# Patient Record
Sex: Female | Born: 1975
Health system: Southern US, Community
[De-identification: ages and names within clinical notes are randomized; demographics above are authoritative.]

## PROBLEM LIST (undated history)

## (undated) DIAGNOSIS — I10 Essential (primary) hypertension: Secondary | ICD-10-CM

## (undated) DIAGNOSIS — K5730 Diverticulosis of large intestine without perforation or abscess without bleeding: Secondary | ICD-10-CM

## (undated) HISTORY — PX: ABDOMINAL HYSTERECTOMY: SHX81

## (undated) HISTORY — PX: COLOSTOMY: SHX63

## (undated) HISTORY — PX: COLOSTOMY REVERSAL: SHX5782

---

## 2013-04-11 ENCOUNTER — Ambulatory Visit: Payer: Self-pay

## 2015-07-09 ENCOUNTER — Ambulatory Visit: Payer: Self-pay | Admitting: Internal Medicine

## 2016-01-01 ENCOUNTER — Encounter (HOSPITAL_BASED_OUTPATIENT_CLINIC_OR_DEPARTMENT_OTHER): Payer: Self-pay | Admitting: Emergency Medicine

## 2016-01-01 ENCOUNTER — Emergency Department (HOSPITAL_BASED_OUTPATIENT_CLINIC_OR_DEPARTMENT_OTHER)
Admission: EM | Admit: 2016-01-01 | Discharge: 2016-01-01 | Disposition: A | Discharge status: Home / Self Care | Payer: Medicaid Other | Attending: Emergency Medicine | Admitting: Emergency Medicine

## 2016-01-01 DIAGNOSIS — J3489 Other specified disorders of nose and nasal sinuses: Secondary | ICD-10-CM | POA: Insufficient documentation

## 2016-01-01 DIAGNOSIS — Y999 Unspecified external cause status: Secondary | ICD-10-CM | POA: Insufficient documentation

## 2016-01-01 DIAGNOSIS — X58XXXA Exposure to other specified factors, initial encounter: Secondary | ICD-10-CM | POA: Insufficient documentation

## 2016-01-01 DIAGNOSIS — R0981 Nasal congestion: Secondary | ICD-10-CM | POA: Insufficient documentation

## 2016-01-01 DIAGNOSIS — S0502XA Injury of conjunctiva and corneal abrasion without foreign body, left eye, initial encounter: Secondary | ICD-10-CM | POA: Insufficient documentation

## 2016-01-01 DIAGNOSIS — R51 Headache: Secondary | ICD-10-CM | POA: Insufficient documentation

## 2016-01-01 DIAGNOSIS — F17200 Nicotine dependence, unspecified, uncomplicated: Secondary | ICD-10-CM | POA: Insufficient documentation

## 2016-01-01 DIAGNOSIS — I10 Essential (primary) hypertension: Secondary | ICD-10-CM | POA: Insufficient documentation

## 2016-01-01 DIAGNOSIS — Y939 Activity, unspecified: Secondary | ICD-10-CM | POA: Insufficient documentation

## 2016-01-01 DIAGNOSIS — H578 Other specified disorders of eye and adnexa: Secondary | ICD-10-CM | POA: Diagnosis present

## 2016-01-01 DIAGNOSIS — Y929 Unspecified place or not applicable: Secondary | ICD-10-CM | POA: Insufficient documentation

## 2016-01-01 HISTORY — DX: Essential (primary) hypertension: I10

## 2016-01-01 HISTORY — DX: Diverticulosis of large intestine without perforation or abscess without bleeding: K57.30

## 2016-01-01 MED ORDER — ACETAMINOPHEN 325 MG PO TABS
650.0000 mg | ORAL_TABLET | Freq: Once | ORAL | Status: AC
  Administered 2016-01-01: 650 mg via ORAL
  Filled 2016-01-01: qty 2

## 2016-01-01 MED ORDER — FLUORESCEIN SODIUM 1 MG OP STRP
1.0000 | ORAL_STRIP | Freq: Once | OPHTHALMIC | Status: AC
  Administered 2016-01-01: 1 via OPHTHALMIC
  Filled 2016-01-01: qty 1

## 2016-01-01 MED ORDER — ERYTHROMYCIN 5 MG/GM OP OINT: 1.0000 "application " | TOPICAL_OINTMENT | Freq: Four times a day (QID) | OPHTHALMIC | Status: DC

## 2016-01-01 MED ORDER — TETRACAINE HCL 0.5 % OP SOLN
1.0000 [drp] | Freq: Once | OPHTHALMIC | Status: AC
  Administered 2016-01-01: 1 [drp] via OPHTHALMIC
  Filled 2016-01-01: qty 4

## 2016-01-01 MED FILL — ERYTHROMYCIN EYE OINTMENT: 5 | 15 days supply | Qty: 4 | Fill #0

## 2016-01-01 NOTE — ED Notes (Signed)
Pt c/o burning and redness to both eyes x 2 weeks. Pt states when she woke up today both eyes were swollen shut. Pt states she has been using stye cream with no relief.

## 2016-01-01 NOTE — Discharge Instructions (Signed)

## 2016-01-01 NOTE — ED Provider Notes (Signed)
CSN: [id number]     Arrival date & time 01/01/16  9562 History   First MD Initiated Contact with Patient 01/01/16 1021     Chief Complaint  Patient presents with  . eye irritation     Florestine Carmical is a 40 y.o. female who presents to the emergency department complaining of left eye irritation for the past 2 weeks that has now progressed to have irritation of her right eye today. The patient reports that for 2 weeks she has had left eye redness, foreign body sensation, irritation and matting discharge. She reports this morning she woke up in both of her eyes were matted shut. She believes of a foreign body sensation to her right thigh. She is unaware of any injury or known foreign body. She does not wear contacts or glasses. She is attended using steroid cream without relief. No fevers. No double vision. She reports sometimes her vision is blurry due to her eyes being watery. She denies fevers, double vision, sore throat, ear pain, ear discharge, or eye injury.  The history is provided by the patient. No language interpreter was used.    Past Medical History  Diagnosis Date  . Hypertension   . Diverticulosis of colon    Past Surgical History  Procedure Laterality Date  . Abdominal hysterectomy    . Colostomy    . Colostomy reversal     No family history on file. Social History  Substance Use Topics  . Smoking status: Current Every Day Smoker  . Smokeless tobacco: None  . Alcohol Use: No   OB History    No data available     Review of Systems  Constitutional: Negative for fever.  HENT: Positive for congestion and rhinorrhea. Negative for ear discharge, ear pain, facial swelling, sore throat and trouble swallowing.   Eyes: Positive for pain, discharge, redness and itching. Negative for visual disturbance.  Respiratory: Negative for cough.   Gastrointestinal: Negative for nausea, vomiting and abdominal pain.  Musculoskeletal: Negative for neck pain.  Skin: Negative for rash.   Neurological: Positive for headaches. Negative for dizziness, syncope, light-headedness and numbness.      Allergies  Review of patient's allergies indicates no known allergies.  Home Medications   Prior to Admission medications   Medication Sig Start Date End Date Taking? Authorizing Provider  erythromycin ophthalmic ointment Place 1 application into both eyes every 6 (six) hours. Place 1/2 inch ribbon of ointment in the affected eye 4 times a day 01/01/16   Everlene Farrier, PA-C   BP 106/84 mmHg  Pulse 63  Temp(Src) 98.2 F (36.8 C) (Oral)  Resp 16  Ht  (1.6 m)  Wt 94.348 kg  BMI 36.85 kg/m2  SpO2 100% Physical Exam  Constitutional: She is oriented to person, place, and time. She appears well-developed and well-nourished. No distress.  Nontoxic appearing.  HENT:  Head: Normocephalic and atraumatic.  Right Ear: External ear normal.  Left Ear: External ear normal.  Mouth/Throat: Oropharynx is clear and moist.  Bilateral tympanic membranes are pearly-gray without erythema or loss of landmarks.   Eyes: EOM are normal. Pupils are equal, round, and reactive to light. Right eye exhibits no discharge. Left eye exhibits no discharge.  Patient has bilateral conjunctival injection which is worse on her left side. EOMs are intact bilaterally. Vision is grossly intact.  Left eye was anesthetized with tetracaine and stained with fluorescein. There appears to be a very small corneal abrasion to the medial aspect of her left  eye. No foreign bodies noted. Patient does have some chemosis noted. Patient reports pain resolved with tetracaine.   Neck: Normal range of motion. Neck supple.  Cardiovascular: Normal rate, regular rhythm, normal heart sounds and intact distal pulses.   Pulmonary/Chest: Effort normal and breath sounds normal. No respiratory distress.  Abdominal: Soft. There is no tenderness.  Lymphadenopathy:    She has no cervical adenopathy.  Neurological: She is alert and  oriented to person, place, and time. No cranial nerve deficit. Coordination normal.  Skin: Skin is warm and dry. No rash noted. She is not diaphoretic. No erythema. No pallor.  Psychiatric: She has a normal mood and affect. Her behavior is normal.  Nursing note and vitals reviewed.   ED Course  Procedures (including critical care time) Labs Review Labs Reviewed - No data to display  Imaging Review No results found.    EKG Interpretation None      Filed Vitals:   01/01/16 0959  BP: 106/84  Pulse: 63  Temp: 98.2 F (36.8 C)  TempSrc: Oral  Resp: 16  Height: 5\' 3"  (1.6 m)  Weight: 94.348 kg  SpO2: 100%      Visual Acuity  Right Eye Distance: 20/50 Left Eye Distance: 20/200 (Patient states she can't see anything but was able to see top line) Bilateral Distance: 20/50 On my exam the patient has  Right eye: 20/40     Left eye: 20/50  Right Eye Near:   Left Eye Near:    Bilateral Near:     MDM   Meds given in ED:  Medications  acetaminophen (TYLENOL) tablet 650 mg (not administered)  fluorescein ophthalmic strip 1 strip (1 strip Both Eyes Given 01/01/16 1035)  tetracaine (PONTOCAINE) 0.5 % ophthalmic solution 1 drop (1 drop Both Eyes Given 01/01/16 1035)    New Prescriptions   ERYTHROMYCIN OPHTHALMIC OINTMENT    Place 1 application into both eyes every 6 (six) hours. Place 1/2 inch ribbon of ointment in the affected eye 4 times a day    Final diagnoses:  Corneal abrasion, left, initial encounter    This is a 40 y.o. female Who presents to the emergency department complaining of left eye irritation for the past 2 weeks that has now progressed to have irritation of her right eye today. The patient reports that for 2 weeks she has had left eye redness, foreign body sensation, irritation and matting discharge. She reports this morning she woke up in both of her eyes were matted shut. She believes of a foreign body sensation to her right thigh. She is unaware of any injury  or known foreign body. She does not wear contacts or glasses. She is attended using steroid cream without relief. No fevers. No double vision. She reports sometimes her vision is blurry due to her eyes being watery. On exam the patient is afebrile nontoxic appearing. She has bilateral conjunctival injection with her left being worse than her right. She complains of pain and foreign body sensation to her left eye. Left eye was anesthetized with tetracaine and stained with fluorescein. Patient appears have a small corneal abrasion to the medial aspect of her left eye. This is where she complains of her pain. She reports her pain completely resolved with tetracaine. Patient with small corneal abrasion. Will provide with erythromycin ophthalmic ointment and encouraged her to follow-up with ophthalmology. She declined any prescriptions for any pain medicine for her eye. I discussed return precautions. I advised the patient to follow-up with their  primary care provider this week. I advised the patient to return to the emergency department with new or worsening symptoms or new concerns. The patient verbalized understanding and agreement with plan.      Everlene Farrier, PA-C 01/01/16 1053  Jerelyn Scott, MD 01/01/16 1058

## 2017-04-18 ENCOUNTER — Emergency Department (HOSPITAL_COMMUNITY): Payer: Self-pay

## 2017-04-18 ENCOUNTER — Encounter (HOSPITAL_COMMUNITY): Payer: Self-pay | Admitting: *Deleted

## 2017-04-18 ENCOUNTER — Emergency Department (HOSPITAL_COMMUNITY)
Admission: EM | Admit: 2017-04-18 | Discharge: 2017-04-18 | Disposition: A | Discharge status: Home / Self Care | Payer: Self-pay | Attending: Emergency Medicine | Admitting: Emergency Medicine

## 2017-04-18 DIAGNOSIS — F329 Major depressive disorder, single episode, unspecified: Secondary | ICD-10-CM

## 2017-04-18 DIAGNOSIS — M5489 Other dorsalgia: Secondary | ICD-10-CM | POA: Insufficient documentation

## 2017-04-18 DIAGNOSIS — I10 Essential (primary) hypertension: Secondary | ICD-10-CM | POA: Insufficient documentation

## 2017-04-18 DIAGNOSIS — M549 Dorsalgia, unspecified: Secondary | ICD-10-CM

## 2017-04-18 DIAGNOSIS — F3289 Other specified depressive episodes: Secondary | ICD-10-CM | POA: Insufficient documentation

## 2017-04-18 DIAGNOSIS — R4589 Other symptoms and signs involving emotional state: Secondary | ICD-10-CM

## 2017-04-18 DIAGNOSIS — G8929 Other chronic pain: Secondary | ICD-10-CM

## 2017-04-18 DIAGNOSIS — F17200 Nicotine dependence, unspecified, uncomplicated: Secondary | ICD-10-CM | POA: Insufficient documentation

## 2017-04-18 DIAGNOSIS — R109 Unspecified abdominal pain: Secondary | ICD-10-CM | POA: Insufficient documentation

## 2017-04-18 LAB — URINALYSIS, ROUTINE W REFLEX MICROSCOPIC
BILIRUBIN URINE: NEGATIVE
Glucose, UA: NEGATIVE mg/dL
Hgb urine dipstick: NEGATIVE
KETONES UR: NEGATIVE mg/dL
Leukocytes, UA: NEGATIVE
NITRITE: NEGATIVE
PROTEIN: NEGATIVE mg/dL
Specific Gravity, Urine: <1.005 — ABNORMAL LOW (ref 1.005–1.030)
pH: 6 (ref 5.0–8.0)

## 2017-04-18 LAB — CBC
HCT: 38.8 % (ref 36.0–46.0)
HEMOGLOBIN: 12.3 g/dL (ref 12.0–15.0)
MCH: 25.3 pg — ABNORMAL LOW (ref 26.0–34.0)
MCHC: 31.7 g/dL (ref 30.0–36.0)
MCV: 79.7 fL (ref 78.0–100.0)
PLATELETS: 305 10*3/uL (ref 150–400)
RBC: 4.87 MIL/uL (ref 3.87–5.11)
RDW: 16.4 % — AB (ref 11.5–15.5)
WBC: 5.8 10*3/uL (ref 4.0–10.5)

## 2017-04-18 LAB — COMPREHENSIVE METABOLIC PANEL
ALBUMIN: 3.7 g/dL (ref 3.5–5.0)
ALK PHOS: 64 U/L (ref 38–126)
ALT: 13 U/L — ABNORMAL LOW (ref 14–54)
ANION GAP: 5 (ref 5–15)
AST: 18 U/L (ref 15–41)
BILIRUBIN TOTAL: 0.4 mg/dL (ref 0.3–1.2)
BUN: 10 mg/dL (ref 6–20)
CALCIUM: 9.5 mg/dL (ref 8.9–10.3)
CO2: 29 mmol/L (ref 22–32)
Chloride: 104 mmol/L (ref 101–111)
Creatinine, Ser: 0.79 mg/dL (ref 0.44–1.00)
GFR calc Af Amer: >60 mL/min (ref >60)
GFR calc non Af Amer: >60 mL/min (ref >60)
GLUCOSE: 102 mg/dL — AB (ref 65–99)
Potassium: 3.9 mmol/L (ref 3.5–5.1)
Sodium: 138 mmol/L (ref 135–145)
TOTAL PROTEIN: 6.6 g/dL (ref 6.5–8.1)

## 2017-04-18 LAB — LIPASE, BLOOD: Lipase: 22 U/L (ref 11–51)

## 2017-04-18 MED ORDER — IOPAMIDOL (ISOVUE-300) INJECTION 61%
INTRAVENOUS | Status: AC
  Administered 2017-04-18: 100 mL
  Filled 2017-04-18: qty 100

## 2017-04-18 MED ORDER — SODIUM CHLORIDE 0.9 % IV SOLN
80.0000 mg | Freq: Once | INTRAVENOUS | Status: AC
  Administered 2017-04-18: 80 mg via INTRAVENOUS
  Filled 2017-04-18: qty 80

## 2017-04-18 MED ORDER — SODIUM CHLORIDE 0.9 % IV BOLUS (SEPSIS)
500.0000 mL | Freq: Once | INTRAVENOUS | Status: AC
  Administered 2017-04-18: 500 mL via INTRAVENOUS

## 2017-04-18 NOTE — ED Triage Notes (Signed)
The pt is c/o abd pain rectal pain back pain headache for months  lmp   none

## 2017-04-18 NOTE — Discharge Instructions (Signed)
Please call the number discharge instructions for referral for primary care follow-up.

## 2017-04-18 NOTE — ED Provider Notes (Signed)
[facility]-EMERGENCY DEPT Provider Note   CSN: [id number] Arrival date & time: 04/18/17  1440     History   Chief Complaint Chief Complaint  Patient presents with  . Generalized Body Aches    HPI Haley Parker is a 41 y.o. female.  HPI This is a 41 year old female history of hypertension who presents today stating that she has had chronic back pain and abdominal pain. She states the back pain has been stable. The abdominal pain has appeared worse it is in the upper abdomen. It is constant in nature. She states that she has had some nausea and decreased appetite but has not lost any weight. She denies fever or chills. She states that menstrual cycles are normal. She denies any vaginal discharge or urinary tract infection symptoms. She denies any bright red blood per rectum or dark tarry stools. She denies any history of gastritis or GI bleeding. Past Medical History:  Diagnosis Date  . Diverticulosis of colon   . Hypertension     There are no active problems to display for this patient.   Past Surgical History:  Procedure Laterality Date  . ABDOMINAL HYSTERECTOMY    . COLOSTOMY    . COLOSTOMY REVERSAL      OB History    No data available       Home Medications    Prior to Admission medications   Medication Sig Start Date End Date Taking? Authorizing Provider  ibuprofen (ADVIL,MOTRIN) 200 MG tablet Take 400 mg by mouth every 6 (six) hours as needed (pain).   Yes [provider]    Family History No family history on file.  Social History Social History  Substance Use Topics  . Smoking status: Current Every Day Smoker  . Smokeless tobacco: Never Used  . Alcohol use No     Allergies   Patient has no known allergies.   Review of Systems Review of Systems  Constitutional: Positive for appetite change. Negative for unexpected weight change.  HENT: Negative.   Eyes: Negative.   Respiratory: Negative.   Cardiovascular: Negative.   Gastrointestinal:  Negative.   Endocrine: Negative.   Genitourinary: Negative.   Musculoskeletal: Negative.   Skin: Negative.   Neurological: Negative.   Hematological: Negative.   Psychiatric/Behavioral: Positive for dysphoric mood. Negative for suicidal ideas.  All other systems reviewed and are negative.    Physical Exam Updated Vital Signs BP (!) 142/94   Pulse 69   Temp 98.7 F (37.1 C) (Oral)   Resp 16   Ht 1.6 m (5\' 3" )   Wt 108.9 kg (240 lb)   SpO2 98%   BMI 42.51 kg/m   Physical Exam  Constitutional: She is oriented to person, place, and time. She appears well-developed and well-nourished. No distress.  HENT:  Head: Normocephalic and atraumatic.  Right Ear: External ear normal.  Left Ear: External ear normal.  Nose: Nose normal.  Eyes: Pupils are equal, round, and reactive to light. Conjunctivae and EOM are normal.  Neck: Normal range of motion. Neck supple.  Pulmonary/Chest: Effort normal.  Musculoskeletal: Normal range of motion.  Neurological: She is alert and oriented to person, place, and time. She exhibits normal muscle tone. Coordination normal.  Skin: Skin is warm and dry. Capillary refill takes less than 2 seconds.  Psychiatric: She has a normal mood and affect. Her speech is normal and behavior is normal. Judgment and thought content normal. Cognition and memory are normal.  Patient endorses some dysphoria but denies any thoughts of  harming herself or anybody else.  Nursing note and vitals reviewed.    ED Treatments / Results  Labs (all labs ordered are listed, but only abnormal results are displayed) Labs Reviewed  COMPREHENSIVE METABOLIC PANEL - Abnormal; Notable for the following:       Result Value   Glucose, Bld 102 (*)    ALT 13 (*)    All other components within normal limits  CBC - Abnormal; Notable for the following:    MCH 25.3 (*)    RDW 16.4 (*)    All other components within normal limits  URINALYSIS, ROUTINE W REFLEX MICROSCOPIC - Abnormal;  Notable for the following:    Specific Gravity, Urine <1.005 (*)    All other components within normal limits  LIPASE, BLOOD    EKG  EKG Interpretation None       Radiology Ct Abdomen Pelvis W Contrast  Result Date: 04/18/2017 CLINICAL DATA:  Subacute onset of left upper quadrant abdominal pain. Initial encounter. EXAM: CT ABDOMEN AND PELVIS WITH CONTRAST TECHNIQUE: Multidetector CT imaging of the abdomen and pelvis was performed using the standard protocol following bolus administration of intravenous contrast. CONTRAST:  ISOVUE-300 IOPAMIDOL (ISOVUE-300) INJECTION 61% COMPARISON:  None. FINDINGS: Lower chest: Minimal scarring is noted at the right lung base. The visualized portions of the mediastinum are unremarkable. Hepatobiliary: The liver is unremarkable in appearance. The gallbladder is unremarkable in appearance. The common bile duct remains normal in caliber. Pancreas: The pancreas is within normal limits. Spleen: The spleen is unremarkable in appearance. Adrenals/Urinary Tract: The adrenal glands are unremarkable in appearance. The kidneys are within normal limits. There is no evidence of hydronephrosis. No renal or ureteral stones are identified. No perinephric stranding is seen. Stomach/Bowel: The stomach is unremarkable in appearance. The small bowel is within normal limits. The appendix is normal in caliber, without evidence of appendicitis. Postoperative change is noted at the sigmoid colon. Minimal diverticulosis is noted near the end-to-side anastomosis. Vascular/Lymphatic: The abdominal aorta is unremarkable in appearance. Minimal calcification is noted at the left common iliac artery. The inferior vena cava is grossly unremarkable. No retroperitoneal lymphadenopathy is seen. No pelvic sidewall lymphadenopathy is identified. Reproductive: The bladder is mildly distended grossly unremarkable. The patient is status post hysterectomy. No suspicious adnexal masses are seen. Other:  Mild postoperative change is noted along the anterior abdominal wall. Musculoskeletal: No acute osseous abnormalities are identified. The visualized musculature is unremarkable in appearance. IMPRESSION: 1. No acute abnormality seen within the abdomen or pelvis. 2. Minimal diverticulosis at the sigmoid colon. Electronically Signed   By: Roanna Raider M.D.   On: 04/18/2017 22:50    Procedures Procedures (including critical care time)  Medications Ordered in ED Medications  sodium chloride 0.9 % bolus 500 mL (500 mLs Intravenous New Bag/Given 04/18/17 2114)  pantoprazole (PROTONIX) 80 mg in sodium chloride 0.9 % 100 mL IVPB (0 mg Intravenous Stopped 04/18/17 2136)  iopamidol (ISOVUE-300) 61 % injection (100 mLs  Contrast Given 04/18/17 2225)     Initial Impression / Assessment and Plan / ED Course  I have reviewed the triage vital signs and the nursing notes.  Pertinent labs & imaging results that were available during my care of the patient were reviewed by me and considered in my medical decision making (see chart for details).     41 year old female presents today complaining of some dysphoria and chronic pain. She endorses that the abdominal pain has worsened recently. A CT of the abdomen is obtained  and shows no acute abnormality. She also states that she has missed work for the past 3 days. She is given a work note to return. She is also given resources in the community for behavioral health. She is advised regarding finding follow-up for primary care and voices understanding.  Final Clinical Impressions(s) / ED Diagnoses   Final diagnoses:  Depressed mood  Chronic abdominal pain  Chronic back pain, unspecified back location, unspecified back pain laterality    New Prescriptions New Prescriptions   No medications on file     Margarita Grizzle, MD 04/19/17 2302

## 2018-09-04 ENCOUNTER — Emergency Department (HOSPITAL_COMMUNITY)
Admission: EM | Admit: 2018-09-04 | Discharge: 2018-09-04 | Disposition: A | Discharge status: Home / Self Care | Payer: Self-pay | Attending: Emergency Medicine | Admitting: Emergency Medicine

## 2018-09-04 ENCOUNTER — Encounter (HOSPITAL_COMMUNITY): Payer: Self-pay

## 2018-09-04 DIAGNOSIS — I10 Essential (primary) hypertension: Secondary | ICD-10-CM | POA: Insufficient documentation

## 2018-09-04 DIAGNOSIS — B029 Zoster without complications: Secondary | ICD-10-CM | POA: Insufficient documentation

## 2018-09-04 DIAGNOSIS — Z79899 Other long term (current) drug therapy: Secondary | ICD-10-CM | POA: Insufficient documentation

## 2018-09-04 DIAGNOSIS — F17210 Nicotine dependence, cigarettes, uncomplicated: Secondary | ICD-10-CM | POA: Insufficient documentation

## 2018-09-04 MED ORDER — VALACYCLOVIR HCL 1 G PO TABS: 1000.0000 mg | ORAL_TABLET | Freq: Three times a day (TID) | ORAL | 0 refills | Status: AC

## 2018-09-04 NOTE — ED Provider Notes (Signed)
[facility]   CSN: [id number] Arrival date & time: 09/04/18  1154     History   Chief Complaint Chief Complaint  Patient presents with  . Rash    HPI Haley Parker is a 43 y.o. female.  The history is provided by the patient. No language interpreter was used.  Rash     Haley Parker is a 43 y.o. female who presents to the Emergency Department complaining of rash. She presents to the emergency department complaining of itchy and painful rash to the left flank that began about four days ago. She developed pain to the left side about five days ago followed by development of the rash. She has a passion her left upper quadrant and pain wraps around her back. She denies any fevers, shortness of breath, nausea, vomiting. She has a history of hypertension. No additional medical problems. She works as a Lawyer[employer]NA. Symptoms are moderate, constant, worsening. Past Medical History:  Diagnosis Date  . Diverticulosis of colon   . Hypertension     There are no active problems to display for this patient.   Past Surgical History:  Procedure Laterality Date  . ABDOMINAL HYSTERECTOMY    . COLOSTOMY    . COLOSTOMY REVERSAL       OB History   No obstetric history on file.      Home Medications    Prior to Admission medications   Medication Sig Start Date End Date Taking? Authorizing Provider  ibuprofen (ADVIL,MOTRIN) 200 MG tablet Take 400 mg by mouth every 6 (six) hours as needed (pain).    [provider]  valACYclovir (VALTREX) 1000 MG tablet Take 1 tablet (1,000 mg total) by mouth 3 (three) times daily for 7 days. 09/04/18 09/11/18  Tilden Fossaees, Micayla Brathwaite, MD    Family History No family history on file.  Social History Social History   Tobacco Use  . Smoking status: Current Every Day Smoker  . Smokeless tobacco: Never Used  Substance Use Topics  . Alcohol use: No  . Drug use: No     Allergies   Patient has no known  allergies.   Review of Systems Review of Systems  Skin: Positive for rash.  All other systems reviewed and are negative.    Physical Exam Updated Vital Signs BP (!) 144/108 (BP Location: Right Arm)   Pulse 64   Temp (!) 97.5 F (36.4 C) (Oral)   Resp 16   SpO2 97%   Physical Exam Vitals signs and nursing note reviewed.  Constitutional:      Appearance: She is well-developed.  HENT:     Head: Normocephalic and atraumatic.  Cardiovascular:     Rate and Rhythm: Normal rate and regular rhythm.  Pulmonary:     Effort: Pulmonary effort is normal. No respiratory distress.  Abdominal:     Palpations: Abdomen is soft.     Tenderness: There is no abdominal tenderness. There is no guarding or rebound.  Musculoskeletal:        General: No tenderness.     Comments: 2+ DP pulses bilaterally  Skin:    General: Skin is warm and dry.     Comments: Vesicular patch to the left upper quadrant with a few scattered papules to the left mid back. There is crusting over the some of the lesions in the left upper quadrant.  Neurological:     Mental Status: She is alert and oriented to person, place, and time.  Comments: Five out of five strength in all four extremities with sensation to light touch intact in all four extremities  Psychiatric:        Behavior: Behavior normal.      ED Treatments / Results  Labs (all labs ordered are listed, but only abnormal results are displayed) Labs Reviewed - No data to display  EKG None  Radiology No results found.  Procedures Procedures (including critical care time)  Medications Ordered in ED Medications - No data to display   Initial Impression / Assessment and Plan / ED Course  I have reviewed the triage vital signs and the nursing notes.  Pertinent labs & imaging results that were available during my care of the patient were reviewed by me and considered in my medical decision making (see chart for details).     Patient here  for evaluation of rash to her left flank. Examination is consistent with zoster. There are no complicating features such as cellulitis, abscess. Only one dermatome appears to be involved. Counseled patient on home care for shingles. Discussed contact precautions. Discussed outpatient follow-up and return precautions.  Final Clinical Impressions(s) / ED Diagnoses   Final diagnoses:  Herpes zoster without complication    ED Discharge Orders         Ordered    valACYclovir (VALTREX) 1000 MG tablet  3 times daily     09/04/18 1333           Tilden Fossa, MD 09/04/18 1336

## 2018-09-04 NOTE — ED Triage Notes (Signed)
Patient here with 6 days of rash with pain to left side, no drainage.

## 2018-09-04 NOTE — ED Notes (Signed)
Declined W/C at D/C and was escorted to lobby by RN. 

## 2018-09-28 ENCOUNTER — Inpatient Hospital Stay: Payer: Self-pay | Admitting: Critical Care Medicine

## 2018-10-20 ENCOUNTER — Inpatient Hospital Stay: Payer: Self-pay | Admitting: Critical Care Medicine

## 2020-03-13 ENCOUNTER — Emergency Department (HOSPITAL_COMMUNITY)
Admission: EM | Admit: 2020-03-13 | Discharge: 2020-03-13 | Disposition: A | Discharge status: Home / Self Care | Payer: Self-pay | Attending: Emergency Medicine | Admitting: Emergency Medicine

## 2020-03-13 ENCOUNTER — Other Ambulatory Visit: Payer: Self-pay

## 2020-03-13 ENCOUNTER — Encounter (HOSPITAL_COMMUNITY): Payer: Self-pay

## 2020-03-13 DIAGNOSIS — Z5321 Procedure and treatment not carried out due to patient leaving prior to being seen by health care provider: Secondary | ICD-10-CM | POA: Insufficient documentation

## 2020-03-13 DIAGNOSIS — K0889 Other specified disorders of teeth and supporting structures: Secondary | ICD-10-CM | POA: Insufficient documentation

## 2020-03-13 MED ORDER — ONDANSETRON 4 MG PO TBDP
4.0000 mg | ORAL_TABLET | Freq: Once | ORAL | Status: AC | PRN
  Administered 2020-03-13: 4 mg via ORAL
  Filled 2020-03-13: qty 1

## 2020-03-13 MED ORDER — OXYCODONE-ACETAMINOPHEN 5-325 MG PO TABS
1.0000 | ORAL_TABLET | Freq: Once | ORAL | Status: AC
  Administered 2020-03-13: 1 via ORAL
  Filled 2020-03-13: qty 1

## 2020-03-13 NOTE — ED Triage Notes (Signed)
Pt reports left upper dental pain for the past 2 days. Swelling noted.

## 2020-03-13 NOTE — ED Notes (Signed)
Pt leaving due to not wanting to wait any longer

## 2020-06-05 ENCOUNTER — Ambulatory Visit: Payer: Self-pay | Admitting: Family Medicine

## 2020-08-29 ENCOUNTER — Ambulatory Visit: Payer: Self-pay | Admitting: Family Medicine

## 2020-11-30 ENCOUNTER — Emergency Department (HOSPITAL_COMMUNITY): Payer: Self-pay

## 2020-11-30 ENCOUNTER — Encounter (HOSPITAL_COMMUNITY): Payer: Self-pay

## 2020-11-30 ENCOUNTER — Emergency Department (HOSPITAL_COMMUNITY)
Admission: EM | Admit: 2020-11-30 | Discharge: 2020-11-30 | Disposition: A | Discharge status: Home / Self Care | Payer: Self-pay | Attending: Emergency Medicine | Admitting: Emergency Medicine

## 2020-11-30 ENCOUNTER — Telehealth: Payer: Self-pay | Admitting: Internal Medicine

## 2020-11-30 DIAGNOSIS — R109 Unspecified abdominal pain: Secondary | ICD-10-CM

## 2020-11-30 DIAGNOSIS — R112 Nausea with vomiting, unspecified: Secondary | ICD-10-CM | POA: Insufficient documentation

## 2020-11-30 DIAGNOSIS — R197 Diarrhea, unspecified: Secondary | ICD-10-CM | POA: Insufficient documentation

## 2020-11-30 DIAGNOSIS — F17200 Nicotine dependence, unspecified, uncomplicated: Secondary | ICD-10-CM | POA: Insufficient documentation

## 2020-11-30 DIAGNOSIS — R1032 Left lower quadrant pain: Secondary | ICD-10-CM | POA: Insufficient documentation

## 2020-11-30 DIAGNOSIS — I10 Essential (primary) hypertension: Secondary | ICD-10-CM | POA: Insufficient documentation

## 2020-11-30 LAB — CBC WITH DIFFERENTIAL/PLATELET
Abs Immature Granulocytes: 0.03 10*3/uL (ref 0.00–0.07)
Basophils Absolute: 0 10*3/uL (ref 0.0–0.1)
Basophils Relative: 0 %
Eosinophils Absolute: 0.1 10*3/uL (ref 0.0–0.5)
Eosinophils Relative: 1 %
HCT: 45.7 % (ref 36.0–46.0)
Hemoglobin: 14.4 g/dL (ref 12.0–15.0)
Immature Granulocytes: 0 %
Lymphocytes Relative: 29 %
Lymphs Abs: 2.3 10*3/uL (ref 0.7–4.0)
MCH: 26.3 pg (ref 26.0–34.0)
MCHC: 31.5 g/dL (ref 30.0–36.0)
MCV: 83.4 fL (ref 80.0–100.0)
Monocytes Absolute: 0.7 10*3/uL (ref 0.1–1.0)
Monocytes Relative: 9 %
Neutro Abs: 4.9 10*3/uL (ref 1.7–7.7)
Neutrophils Relative %: 61 %
Platelets: 326 10*3/uL (ref 150–400)
RBC: 5.48 MIL/uL — ABNORMAL HIGH (ref 3.87–5.11)
RDW: 15.2 % (ref 11.5–15.5)
WBC: 8 10*3/uL (ref 4.0–10.5)
nRBC: 0 % (ref 0.0–0.2)

## 2020-11-30 LAB — URINALYSIS, ROUTINE W REFLEX MICROSCOPIC
Bacteria, UA: NONE SEEN
Bilirubin Urine: NEGATIVE
Glucose, UA: NEGATIVE mg/dL
Hgb urine dipstick: NEGATIVE
Ketones, ur: 5 mg/dL — AB
Leukocytes,Ua: NEGATIVE
Nitrite: NEGATIVE
Protein, ur: 30 mg/dL — AB
Specific Gravity, Urine: >1.046 — ABNORMAL HIGH (ref 1.005–1.030)
pH: 6 (ref 5.0–8.0)

## 2020-11-30 LAB — COMPREHENSIVE METABOLIC PANEL
ALT: 15 U/L (ref 0–44)
AST: 16 U/L (ref 15–41)
Albumin: 4.8 g/dL (ref 3.5–5.0)
Alkaline Phosphatase: 74 U/L (ref 38–126)
Anion gap: 9 (ref 5–15)
BUN: 16 mg/dL (ref 6–20)
CO2: 28 mmol/L (ref 22–32)
Calcium: 9.8 mg/dL (ref 8.9–10.3)
Chloride: 101 mmol/L (ref 98–111)
Creatinine, Ser: 0.91 mg/dL (ref 0.44–1.00)
GFR, Estimated: >60 mL/min (ref >60)
Glucose, Bld: 120 mg/dL — ABNORMAL HIGH (ref 70–99)
Potassium: 3.8 mmol/L (ref 3.5–5.1)
Sodium: 138 mmol/L (ref 135–145)
Total Bilirubin: 0.7 mg/dL (ref 0.3–1.2)
Total Protein: 8.2 g/dL — ABNORMAL HIGH (ref 6.5–8.1)

## 2020-11-30 LAB — LIPASE, BLOOD: Lipase: 32 U/L (ref 11–51)

## 2020-11-30 MED ORDER — HYDROMORPHONE HCL 1 MG/ML IJ SOLN
1.0000 mg | Freq: Once | INTRAMUSCULAR | Status: AC
  Administered 2020-11-30: 1 mg via INTRAVENOUS
  Filled 2020-11-30: qty 1

## 2020-11-30 MED ORDER — ONDANSETRON HCL 4 MG PO TABS: 4.0000 mg | ORAL_TABLET | Freq: Four times a day (QID) | ORAL | 0 refills | Status: AC

## 2020-11-30 MED ORDER — SUCRALFATE 1 GM/10ML PO SUSP: 1.0000 g | Freq: Three times a day (TID) | ORAL | 0 refills | Status: AC

## 2020-11-30 MED ORDER — PANTOPRAZOLE SODIUM 20 MG PO TBEC: 20.0000 mg | DELAYED_RELEASE_TABLET | Freq: Every day | ORAL | 0 refills | Status: AC

## 2020-11-30 MED ORDER — SODIUM CHLORIDE 0.9 % IV BOLUS
1000.0000 mL | Freq: Once | INTRAVENOUS | Status: AC
  Administered 2020-11-30: 1000 mL via INTRAVENOUS

## 2020-11-30 MED ORDER — FAMOTIDINE IN NACL 20-0.9 MG/50ML-% IV SOLN
20.0000 mg | Freq: Once | INTRAVENOUS | Status: AC
  Administered 2020-11-30: 20 mg via INTRAVENOUS
  Filled 2020-11-30: qty 50

## 2020-11-30 MED ORDER — IOHEXOL 300 MG/ML  SOLN
100.0000 mL | Freq: Once | INTRAMUSCULAR | Status: AC | PRN
  Administered 2020-11-30: 100 mL via INTRAVENOUS

## 2020-11-30 MED ORDER — DICLOFENAC SODIUM 1 % EX GEL: 2.0000 g | Freq: Four times a day (QID) | CUTANEOUS | 0 refills | Status: AC

## 2020-11-30 MED ORDER — ONDANSETRON HCL 4 MG/2ML IJ SOLN
4.0000 mg | Freq: Once | INTRAMUSCULAR | Status: AC
  Administered 2020-11-30: 4 mg via INTRAVENOUS
  Filled 2020-11-30: qty 2

## 2020-11-30 MED ORDER — MORPHINE SULFATE (PF) 4 MG/ML IV SOLN
4.0000 mg | Freq: Once | INTRAVENOUS | Status: AC
  Administered 2020-11-30: 4 mg via INTRAVENOUS
  Filled 2020-11-30: qty 1

## 2020-11-30 NOTE — ED Notes (Signed)
Patient complaining of nausea. Patient ambulatory to restroom with stand by assistance.

## 2020-11-30 NOTE — Discharge Instructions (Signed)
Take zofran for nausea.   Take protonix and carafate as directed for your abdominal pain.   Please follow up with your gastroenterology doctor within the next 5-7 days.  If you do not have a primary care provider, information for a healthcare clinic has been provided for you to make arrangements for follow up care. Please return to the ER sooner if you have any new or worsening symptoms, or if you have any of the following symptoms:  Abdominal pain that does not go away.  You have a fever.  You keep throwing up (vomiting).  The pain is felt only in portions of the abdomen. Pain in the right side could possibly be appendicitis. In an adult, pain in the left lower portion of the abdomen could be colitis or diverticulitis.  You pass bloody or black tarry stools.  There is bright red blood in the stool.  The constipation stays for more than 4 days.  There is belly (abdominal) or rectal pain.  You do not seem to be getting better.  You have any questions or concerns.

## 2020-11-30 NOTE — ED Provider Notes (Signed)
[facility]-EMERGENCY DEPT Provider Note   CSN: [id number] Arrival date & time: 11/30/20  8366     History Chief Complaint  Patient presents with  . Abdominal Pain    Haley Parker is a 45 y.o. female.  HPI   Pt is a 46 y/o female with a h/o diverticulosis, HTN, who presents to the ED today for eval of abd pain. The pain started about 6 days ago and is located in the LLQ/LUQ. Pain is intermittent in nature. States pain feels like a heavy pain. Rates pain 9/10. She has tried tylenol and ibuprofen without relief. Reports associated nausea, vomiting, diarrhea. She thinks she may have had some blood in her urine a few days ago which has since resolved.   Denies fevers, hematochezia, melena, hematemesis, dysuria, frequency.   States she is in the process of establishing with GI and has a colonoscopy scheduled 4/14 in [place].   Denies heavy etoh use, or heavy nsaid use.  Past Medical History:  Diagnosis Date  . Diverticulosis of colon   . Hypertension     There are no problems to display for this patient.   Past Surgical History:  Procedure Laterality Date  . ABDOMINAL HYSTERECTOMY    . COLOSTOMY    . COLOSTOMY REVERSAL       OB History   No obstetric history on file.     History reviewed. No pertinent family history.  Social History   Tobacco Use  . Smoking status: Current Every Day Smoker  . Smokeless tobacco: Never Used  Substance Use Topics  . Alcohol use: No  . Drug use: No    Home Medications Prior to Admission medications   Medication Sig Start Date End Date Taking? Authorizing Provider  acetaminophen (TYLENOL) 325 MG tablet Take 650 mg by mouth every 6 (six) hours as needed for mild pain, fever or headache.   Yes [provider]  ibuprofen (ADVIL,MOTRIN) 200 MG tablet Take 400 mg by mouth every 6 (six) hours as needed (pain).   Yes [provider]  ondansetron (ZOFRAN) 4 MG tablet Take 1 tablet (4 mg total)  by mouth every 6 (six) hours. 11/30/20  Yes Saryah Loper S, PA-C  pantoprazole (PROTONIX) 20 MG tablet Take 1 tablet (20 mg total) by mouth daily for 14 days. 11/30/20 12/14/20 Yes Josiephine Simao S, PA-C  sucralfate (CARAFATE) 1 GM/10ML suspension Take 10 mLs (1 g total) by mouth 4 (four) times daily -  with meals and at bedtime. 11/30/20  Yes Deztiny Sarra S, PA-C    Allergies    Patient has no known allergies.  Review of Systems   Review of Systems  Constitutional: Negative for chills and fever.  HENT: Negative for ear pain and sore throat.   Eyes: Negative for pain and visual disturbance.  Respiratory: Negative for cough and shortness of breath.   Cardiovascular: Negative for chest pain.  Gastrointestinal: Positive for abdominal pain, diarrhea, nausea and vomiting. Negative for blood in stool and constipation.  Genitourinary: Negative for dysuria and hematuria.  Musculoskeletal: Negative for back pain.  Skin: Negative for rash.  Neurological: Negative for seizures and syncope.  All other systems reviewed and are negative.   Physical Exam Updated Vital Signs BP (!) 142/93 Comment: Simultaneous filing. User may not have seen previous data.  Pulse 73 Comment: Simultaneous filing. User may not have seen previous data.  Temp 98.9 F (37.2 C) (Oral)   Resp 20 Comment: Simultaneous filing. User may not have  seen previous data.  SpO2 96% Comment: Simultaneous filing. User may not have seen previous data.  Physical Exam Vitals and nursing note reviewed.  Constitutional:      General: She is not in acute distress.    Appearance: She is well-developed.  HENT:     Head: Normocephalic and atraumatic.  Eyes:     Conjunctiva/sclera: Conjunctivae normal.  Cardiovascular:     Rate and Rhythm: Normal rate and regular rhythm.     Heart sounds: Normal heart sounds. No murmur heard.   Pulmonary:     Effort: Pulmonary effort is normal. No respiratory distress.     Breath sounds: Normal  breath sounds. No wheezing, rhonchi or rales.  Abdominal:     General: Bowel sounds are normal.     Palpations: Abdomen is soft.     Tenderness: There is abdominal tenderness in the left upper quadrant and left lower quadrant. There is guarding. There is no rebound.  Musculoskeletal:     Cervical back: Neck supple.  Skin:    General: Skin is warm and dry.  Neurological:     Mental Status: She is alert.     ED Results / Procedures / Treatments   Labs (all labs ordered are listed, but only abnormal results are displayed) Labs Reviewed  CBC WITH DIFFERENTIAL/PLATELET - Abnormal; Notable for the following components:      Result Value   RBC 5.48 (*)    All other components within normal limits  COMPREHENSIVE METABOLIC PANEL - Abnormal; Notable for the following components:   Glucose, Bld 120 (*)    Total Protein 8.2 (*)    All other components within normal limits  URINALYSIS, ROUTINE W REFLEX MICROSCOPIC - Abnormal; Notable for the following components:   Specific Gravity, Urine >1.046 (*)    Ketones, ur 5 (*)    Protein, ur 30 (*)    All other components within normal limits  LIPASE, BLOOD    EKG None  Radiology CT ABDOMEN PELVIS W CONTRAST  Result Date: 11/30/2020 CLINICAL DATA:  Diverticulitis suspected with LEFT lower quadrant abdominal pain and back pain history of diverticulitis. EXAM: CT ABDOMEN AND PELVIS WITH CONTRAST TECHNIQUE: Multidetector CT imaging of the abdomen and pelvis was performed using the standard protocol following bolus administration of intravenous contrast. CONTRAST:  OMNIPAQUE IOHEXOL 300 MG/ML  SOLN COMPARISON:  April 18, 2017 FINDINGS: Lower chest: Incidental imaging of the lung bases with signs of atelectasis. No effusion. No consolidation. Hepatobiliary: No focal, suspicious hepatic lesion. Tiny hypodensity in the LEFT hepatic lobe is likely a small cyst measuring approximately 4 mm. Signs of suspected hepatic steatosis. Portal vein is  patent. Hepatic veins are patent. No pericholecystic stranding. No biliary duct dilation. Pancreas: Normal, without mass, inflammation or ductal dilatation. Spleen: Spleen normal size and contour. Adrenals/Urinary Tract: Adrenal glands are normal. Symmetric renal enhancement. No hydronephrosis. Smooth contour of the urinary bladder. Stomach/Bowel: Stomach and small bowel without sign of acute process. Small bowel loops are closely applied to the under surface of the abdominal wall in the LEFT lower quadrant with similar appearance compared to prior imaging presumably the site of prior ostomy. There appears to be mesh anterior to the small bowel perhaps placed at the site of the ostomy or due to hernia related to the ostomy in the past. Normal appendix. No acute colonic process. Diverticulosis of the redundant anastomotic site in the rectosigmoid region. Redundant colon extends from the anastomosis from LEFT to RIGHT in the pelvis. No  adjacent stranding. No adjacent fluid. No sign of obstruction. Distal small bowel loops shows some mild submucosal low attenuation but without perienteric stranding. Vascular/Lymphatic: Normal caliber abdominal aorta. Minimal atheromatous plaque. Patent abdominal vessels There is no gastrohepatic or hepatoduodenal ligament lymphadenopathy. No retroperitoneal or mesenteric lymphadenopathy. No pelvic sidewall lymphadenopathy. Reproductive: Post hysterectomy. Urinary bladder with smooth contour. No adnexal masses. Other: No free air.  No ascites. Musculoskeletal: Asymmetric sclerosis of the sacroiliac joints. Mild irregularity of the inferior aspect of the joints. No acute bone finding. No destructive bone process. IMPRESSION: 1. No acute colonic process. Diverticulosis of the redundant anastomotic site in the rectosigmoid region. No adjacent stranding. 2. Small bowel loops in the LEFT upper quadrant closely applied to the under surface of mesh reconstructive changes along the anterior  abdominal wall no sign of obstruction. 3. Mild low attenuation in the submucosal portion of distal small bowel loops could be related to prior enteritis, no current perienteric stranding or hyperenhancement. Correlation with history of prior enteritis is suggested or recurrent abdominal pain. 4. Question of sacroiliitis. 5. Suspected hepatic steatosis. 6. Aortic atherosclerosis. Aortic Atherosclerosis (ICD10-I70.0). Electronically Signed   By: Donzetta KohutGeoffrey  Wile M.D.   On: 11/30/2020 12:15    Procedures Procedures   Medications Ordered in ED Medications  sodium chloride 0.9 % bolus 1,000 mL (0 mLs Intravenous Stopped 11/30/20 1306)  morphine 4 MG/ML injection 4 mg (4 mg Intravenous Given 11/30/20 1026)  ondansetron (ZOFRAN) injection 4 mg (4 mg Intravenous Given 11/30/20 1026)  HYDROmorphone (DILAUDID) injection 1 mg (1 mg Intravenous Given 11/30/20 1108)  iohexol (OMNIPAQUE) 300 MG/ML solution 100 mL (100 mLs Intravenous Contrast Given 11/30/20 1126)  HYDROmorphone (DILAUDID) injection 1 mg (1 mg Intravenous Given 11/30/20 1306)  ondansetron (ZOFRAN) injection 4 mg (4 mg Intravenous Given 11/30/20 1311)  famotidine (PEPCID) IVPB 20 mg premix (20 mg Intravenous New Bag/Given 11/30/20 1335)    ED Course  I have reviewed the triage vital signs and the nursing notes.  Pertinent labs & imaging results that were available during my care of the patient were reviewed by me and considered in my medical decision making (see chart for details).    MDM Rules/Calculators/A&P                          45 y/o F with h/o diverticulitis presents to the ED for eval of llq abd pain  Reviewed/interpreted labs CBC unremarkable CMP unremarkable Lipase unremarkable UA neg for uti  Reviewed/interpreted imaging CT abd/pelvis - 1. No acute colonic process. Diverticulosis of the redundant anastomotic site in the rectosigmoid region. No adjacent stranding. 2. Small bowel loops in the LEFT upper quadrant closely applied to the  under surface of mesh reconstructive changes along the anterior abdominal wall no sign of obstruction. 3. Mild low attenuation in the submucosal portion of distal small bowel loops could be related to prior enteritis, no current perienteric stranding or hyperenhancement. Correlation with history of prior enteritis is suggested or recurrent abdominal pain. 4. Question of sacroiliitis. 5. Suspected hepatic steatosis. 6. Aortic atherosclerosis. Aortic Atherosclerosis  On reassessment pt states abd pain feels somewhat improved. She has been able to tolerate po. Her w/u here is reassuring. I suspect she may have gastritis or pud. She has also have a viral illness given the nvd. We will tx with zofran, protonix and carafate. She is also requesting meds for her lower back pain. she has some msk ttp on exam but no red flag  sxs. Will give Have advised f/u with pcp/gi and to return if worse. She voices understanding and is in agreement with plan. All questions answered, pt stable for discharge.     Final Clinical Impression(s) / ED Diagnoses Final diagnoses:  Abdominal pain, unspecified abdominal location    Rx / DC Orders ED Discharge Orders         Ordered    pantoprazole (PROTONIX) 20 MG tablet  Daily        11/30/20 1435    ondansetron (ZOFRAN) 4 MG tablet  Every 6 hours        11/30/20 1435    sucralfate (CARAFATE) 1 GM/10ML suspension  3 times daily with meals & bedtime        11/30/20 1435           Brae Schaafsma S, PA-C 11/30/20 1436    Benjiman Core, MD 11/30/20 1452

## 2020-11-30 NOTE — Telephone Encounter (Signed)
Copied from CRM [id number]. Topic: Appointment Scheduling - Scheduling Inquiry for Clinic >> Nov 27, 2020  9:39 AM [name] wrote: Reason for CRM: Pt called asking for an appt to obtain an orange card.  CB#  [phone]  Called patient and LVM advising patient to call [phone] to schedule an appointment. If patient calls back please let patient know she first needs to have an establish care appointment with one of our provider prior to being scheduled for orange card.

## 2020-11-30 NOTE — ED Triage Notes (Signed)
Pt presents with c/o abdominal pain, back pain on both sides of her back with associated nausea and vomiting.

## 2022-09-25 IMAGING — CT CT ABD-PELV W/ CM
2 of 5 series · 15 of 46 positions shown, 17 images · IV contrast (omnipaque)
Comparison: April 18, 2017

CLINICAL DATA: Diverticulitis suspected with LEFT lower quadrant
abdominal pain and back pain history of diverticulitis.

EXAM:
CT ABDOMEN AND PELVIS WITH CONTRAST
TECHNIQUE: Multidetector CT imaging of the abdomen and pelvis was performed
using the standard protocol following bolus administration of
intravenous contrast.
CONTRAST:  100mL OMNIPAQUE IOHEXOL 300 MG/ML  SOLN

[Series 2: axial st · axial · 0.67mm/px · z∈[-758,-388]mm · 12 of 88 slices shown, 14 images]
[im 7/88  soft-tissue]
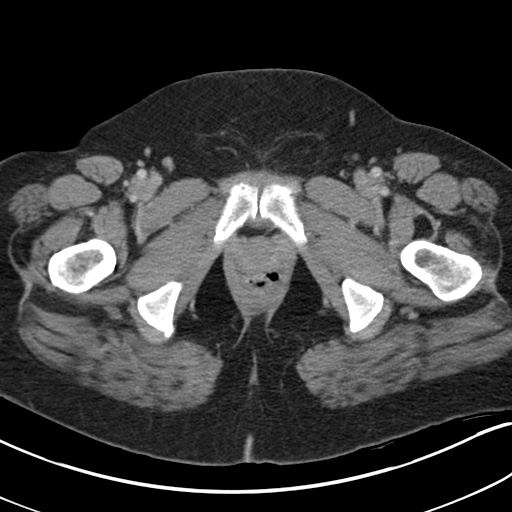
[im 7/88  bone]
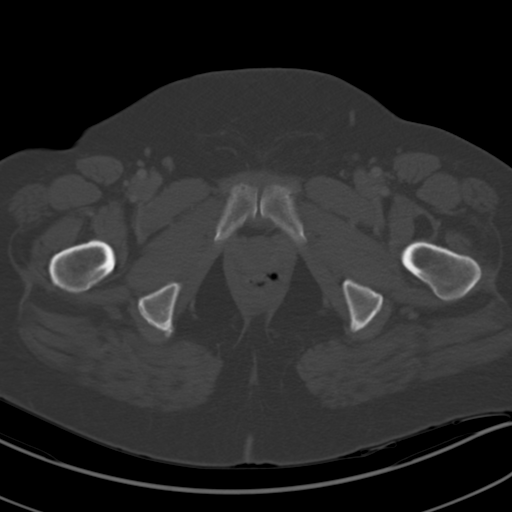
[im 13/88  soft-tissue]
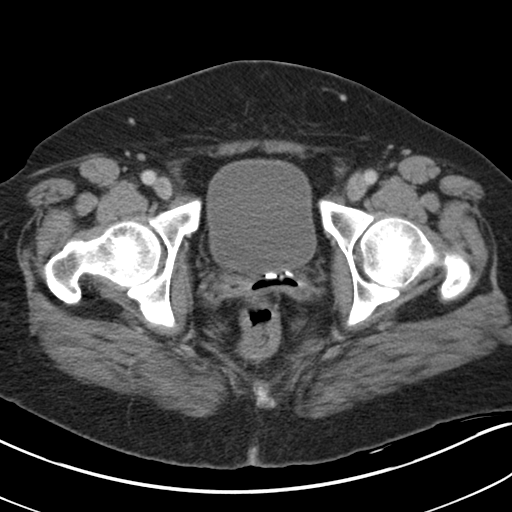
[im 19/88  soft-tissue]
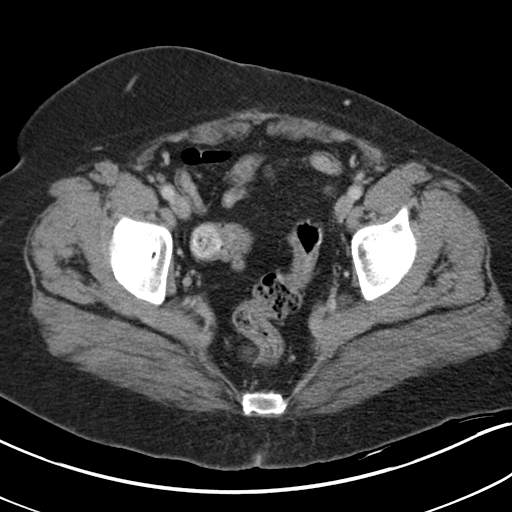
[im 25/88  soft-tissue]
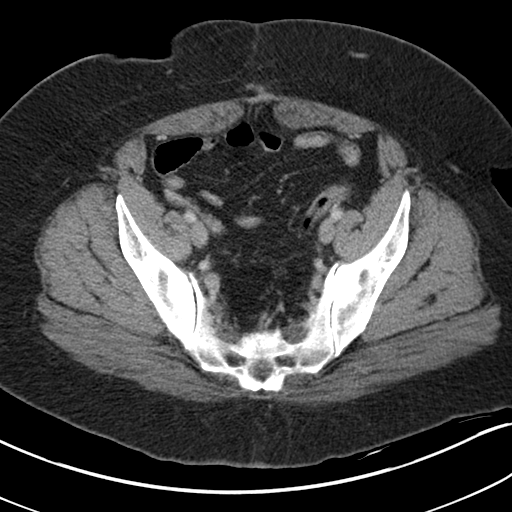
[im 32/88  soft-tissue]
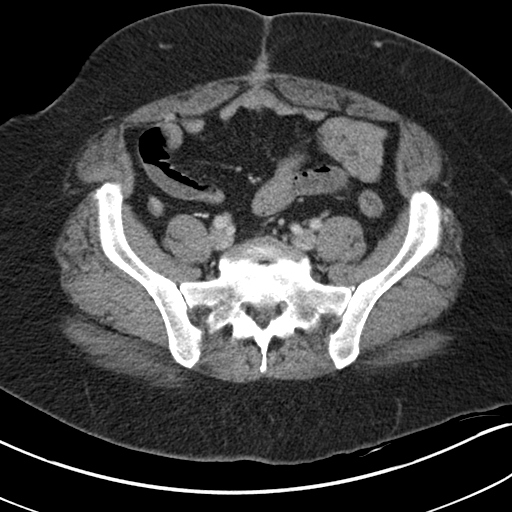
[im 38/88  soft-tissue]
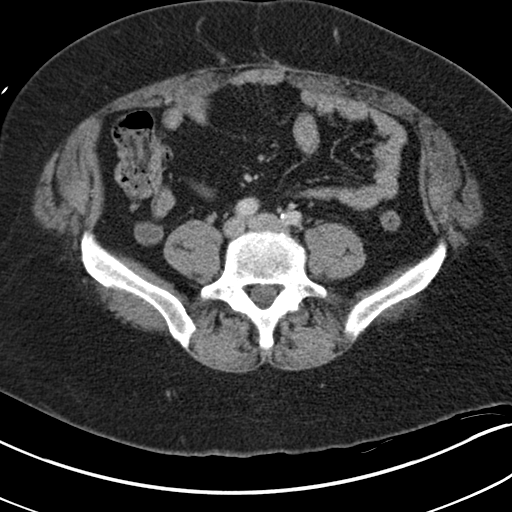
[im 50/88  soft-tissue]
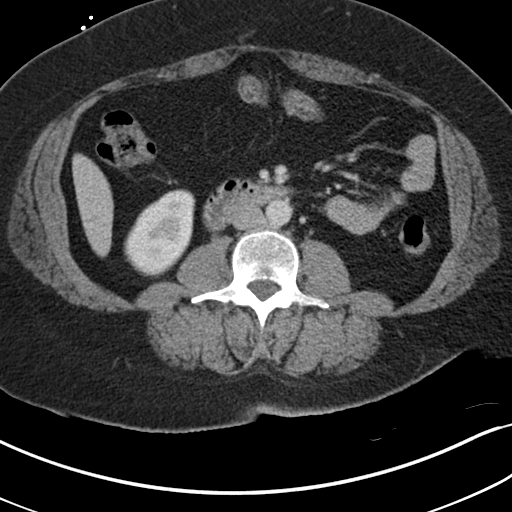
[im 56/88  soft-tissue]
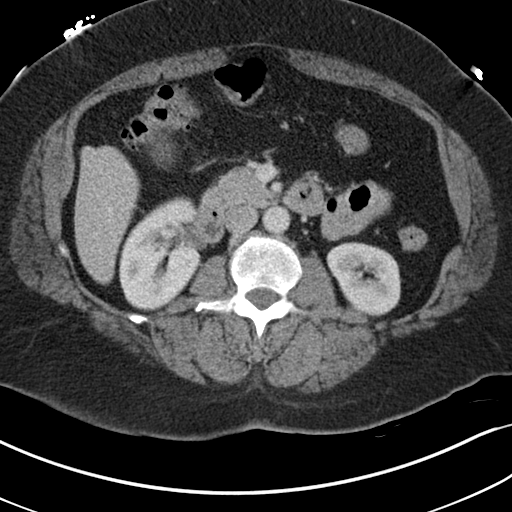
[im 63/88  soft-tissue]
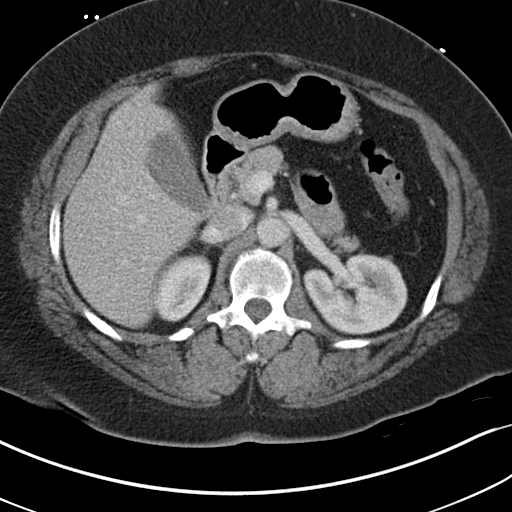
[im 63/88  bone]
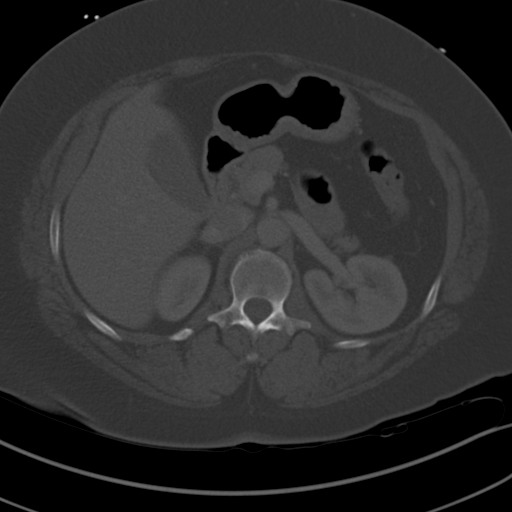
[im 69/88  soft-tissue]
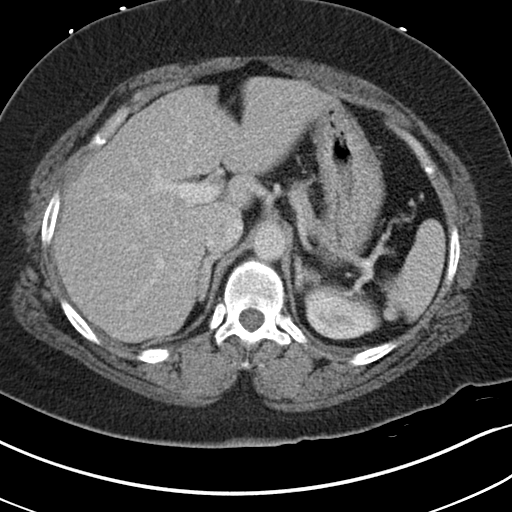
[im 75/88  soft-tissue]
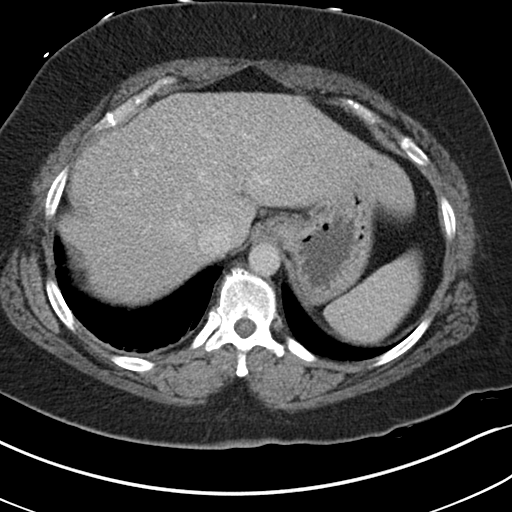
[im 81/88  soft-tissue]
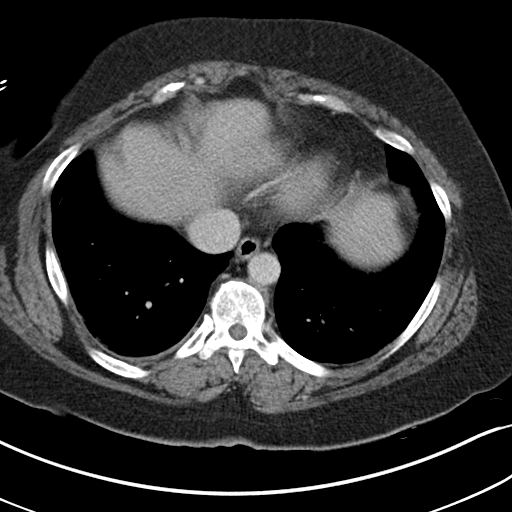

[Series 4: coronal st · coronal · 0.90mm/px · 3 of 136 slices shown]
[im 46/136  soft-tissue]
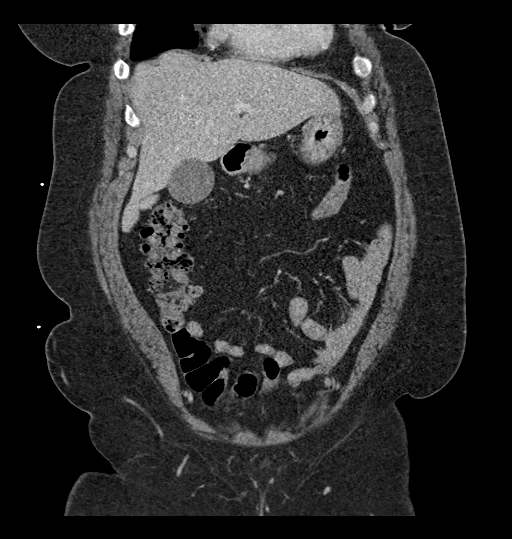
[im 61/136  soft-tissue]
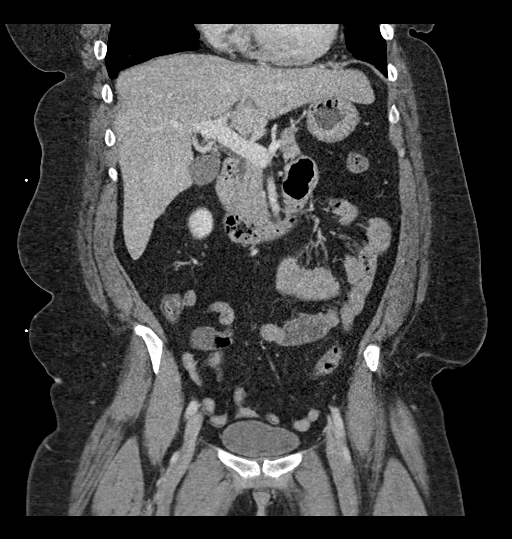
[im 76/136  soft-tissue]
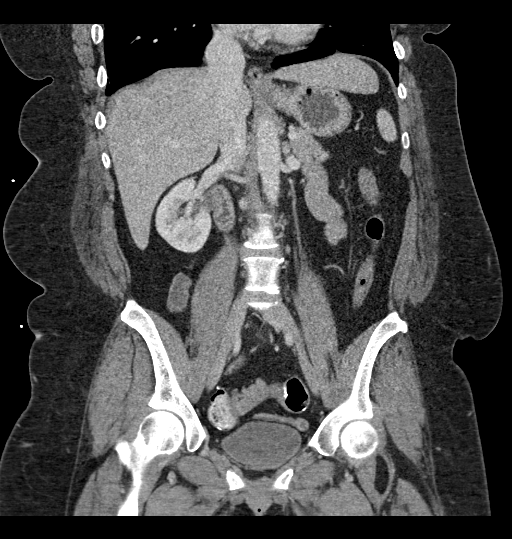

[15 of 46 positions shown; findings below may reference images not displayed]

FINDINGS: Lower chest: Incidental imaging of the lung bases with signs of
atelectasis. No effusion. No consolidation.

Hepatobiliary: No focal, suspicious hepatic lesion. Tiny hypodensity
in the LEFT hepatic lobe is likely a small cyst measuring
approximately 4 mm. Signs of suspected hepatic steatosis. Portal
vein is patent. Hepatic veins are patent. No pericholecystic
stranding. No biliary duct dilation.

Pancreas: Normal, without mass, inflammation or ductal dilatation.

Spleen: Spleen normal size and contour.

Adrenals/Urinary Tract: Adrenal glands are normal. Symmetric renal
enhancement. No hydronephrosis. Smooth contour of the urinary
bladder.

Stomach/Bowel: Stomach and small bowel without sign of acute
process. Small bowel loops are closely applied to the under surface
of the abdominal wall in the LEFT lower quadrant with similar
appearance compared to prior imaging presumably the site of prior
ostomy. There appears to be mesh anterior to the small bowel perhaps
placed at the site of the ostomy or due to hernia related to the
ostomy in the past. Normal appendix.

No acute colonic process. Diverticulosis of the redundant
anastomotic site in the rectosigmoid region. Redundant colon extends
from the anastomosis from LEFT to RIGHT in the pelvis. No adjacent
stranding. No adjacent fluid. No sign of obstruction.

Distal small bowel loops shows some mild submucosal low attenuation
but without perienteric stranding.

Vascular/Lymphatic: Normal caliber abdominal aorta. Minimal
atheromatous plaque. Patent abdominal vessels There is no
gastrohepatic or hepatoduodenal ligament lymphadenopathy. No
retroperitoneal or mesenteric lymphadenopathy.

No pelvic sidewall lymphadenopathy.

Reproductive: Post hysterectomy. Urinary bladder with smooth
contour. No adnexal masses.

Other: No free air.  No ascites.

Musculoskeletal: Asymmetric sclerosis of the sacroiliac joints. Mild
irregularity of the inferior aspect of the joints. No acute bone
finding. No destructive bone process.
IMPRESSION: 1. No acute colonic process. Diverticulosis of the redundant
anastomotic site in the rectosigmoid region. No adjacent stranding.
2. Small bowel loops in the LEFT upper quadrant closely applied to
the under surface of mesh reconstructive changes along the anterior
abdominal wall no sign of obstruction.
3. Mild low attenuation in the submucosal portion of distal small
bowel loops could be related to prior enteritis, no current
perienteric stranding or hyperenhancement. Correlation with history
of prior enteritis is suggested or recurrent abdominal pain.
4. Question of sacroiliitis.
5. Suspected hepatic steatosis.
6. Aortic atherosclerosis.

Aortic Atherosclerosis (OGGH2-X27.7).

## 2023-03-26 DIAGNOSIS — Z111 Encounter for screening for respiratory tuberculosis: Secondary | ICD-10-CM | POA: Diagnosis not present

## 2023-03-28 DIAGNOSIS — Z111 Encounter for screening for respiratory tuberculosis: Secondary | ICD-10-CM | POA: Diagnosis not present
